# Patient Record
Sex: Female | Born: 1975 | Hispanic: No | Marital: Married
Health system: Southern US, Community
[De-identification: ages and names within clinical notes are randomized; demographics above are authoritative.]

---

## 2000-10-03 ENCOUNTER — Other Ambulatory Visit: Admission: RE | Admit: 2000-10-03 | Discharge: 2000-10-03 | Payer: Self-pay | Admitting: *Deleted

## 2002-01-02 ENCOUNTER — Other Ambulatory Visit: Admission: RE | Admit: 2002-01-02 | Discharge: 2002-01-02 | Payer: Self-pay | Admitting: *Deleted

## 2006-02-15 ENCOUNTER — Encounter: Admission: RE | Admit: 2006-02-15 | Discharge: 2006-02-15 | Payer: Self-pay | Admitting: Internal Medicine

## 2006-05-23 ENCOUNTER — Encounter: Admission: RE | Admit: 2006-05-23 | Discharge: 2006-08-21 | Payer: Self-pay | Admitting: Surgery

## 2006-05-24 ENCOUNTER — Ambulatory Visit (HOSPITAL_COMMUNITY): Admission: RE | Admit: 2006-05-24 | Discharge: 2006-05-24 | Payer: Self-pay | Admitting: Gastroenterology

## 2006-05-28 ENCOUNTER — Ambulatory Visit (HOSPITAL_COMMUNITY): Admission: RE | Admit: 2006-05-28 | Discharge: 2006-05-28 | Payer: Self-pay | Admitting: Surgery

## 2006-07-04 ENCOUNTER — Ambulatory Visit (HOSPITAL_BASED_OUTPATIENT_CLINIC_OR_DEPARTMENT_OTHER): Admission: RE | Admit: 2006-07-04 | Discharge: 2006-07-04 | Payer: Self-pay | Admitting: Surgery

## 2006-07-14 ENCOUNTER — Ambulatory Visit: Payer: Self-pay | Admitting: Internal Medicine

## 2006-12-16 ENCOUNTER — Encounter: Admission: RE | Admit: 2006-12-16 | Discharge: 2007-03-16 | Payer: Self-pay | Admitting: Surgery

## 2006-12-30 ENCOUNTER — Inpatient Hospital Stay (HOSPITAL_COMMUNITY): Admission: RE | Admit: 2006-12-30 | Discharge: 2007-01-01 | Payer: Self-pay | Admitting: General Surgery

## 2006-12-31 ENCOUNTER — Ambulatory Visit: Payer: Self-pay | Admitting: Vascular Surgery

## 2007-03-18 ENCOUNTER — Encounter: Admission: RE | Admit: 2007-03-18 | Discharge: 2007-06-04 | Payer: Self-pay | Admitting: Surgery

## 2009-07-22 ENCOUNTER — Ambulatory Visit (HOSPITAL_COMMUNITY): Admission: RE | Admit: 2009-07-22 | Discharge: 2009-07-22 | Payer: Self-pay | Admitting: Internal Medicine

## 2009-07-29 ENCOUNTER — Ambulatory Visit (HOSPITAL_COMMUNITY): Admission: RE | Admit: 2009-07-29 | Discharge: 2009-07-29 | Payer: Self-pay | Admitting: Internal Medicine

## 2009-08-02 ENCOUNTER — Ambulatory Visit (HOSPITAL_COMMUNITY): Admission: RE | Admit: 2009-08-02 | Discharge: 2009-08-02 | Payer: Self-pay | Admitting: Obstetrics and Gynecology

## 2009-08-23 DEATH — deceased

## 2009-08-24 ENCOUNTER — Ambulatory Visit (HOSPITAL_COMMUNITY): Admission: RE | Admit: 2009-08-24 | Discharge: 2009-08-24 | Payer: Self-pay | Admitting: Obstetrics and Gynecology

## 2010-08-23 ENCOUNTER — Inpatient Hospital Stay (HOSPITAL_COMMUNITY): Admission: AD | Admit: 2010-08-23 | Payer: 59 | Source: Ambulatory Visit | Admitting: Obstetrics and Gynecology

## 2010-08-28 ENCOUNTER — Inpatient Hospital Stay (HOSPITAL_COMMUNITY)
Admission: AD | Admit: 2010-08-28 | Discharge: 2010-09-01 | DRG: 775 | Disposition: A | Discharge status: Home / Self Care | Payer: 59 | Source: Ambulatory Visit | Attending: Obstetrics and Gynecology | Admitting: Obstetrics and Gynecology

## 2010-08-28 DIAGNOSIS — O480 Post-term pregnancy: Principal | ICD-10-CM | POA: Diagnosis present

## 2010-08-28 DIAGNOSIS — O878 Other venous complications in the puerperium: Secondary | ICD-10-CM | POA: Diagnosis present

## 2010-08-28 DIAGNOSIS — E039 Hypothyroidism, unspecified: Secondary | ICD-10-CM | POA: Diagnosis present

## 2010-08-28 DIAGNOSIS — O99844 Bariatric surgery status complicating childbirth: Secondary | ICD-10-CM | POA: Diagnosis present

## 2010-08-28 DIAGNOSIS — E079 Disorder of thyroid, unspecified: Secondary | ICD-10-CM | POA: Diagnosis present

## 2010-08-28 DIAGNOSIS — K649 Unspecified hemorrhoids: Secondary | ICD-10-CM | POA: Diagnosis present

## 2010-08-31 LAB — CBC
MCH: 29 pg (ref 26.0–34.0)
Platelets: 157 10*3/uL (ref 150–400)
RDW: 13.2 % (ref 11.5–15.5)

## 2010-09-06 ENCOUNTER — Ambulatory Visit (HOSPITAL_COMMUNITY)
Admission: RE | Admit: 2010-09-06 | Discharge: 2010-09-06 | Disposition: A | Discharge status: Home / Self Care | Payer: 59 | Source: Ambulatory Visit | Attending: Obstetrics and Gynecology | Admitting: Obstetrics and Gynecology

## 2010-09-06 DIAGNOSIS — O9279 Other disorders of lactation: Secondary | ICD-10-CM | POA: Insufficient documentation

## 2010-09-11 ENCOUNTER — Ambulatory Visit (HOSPITAL_COMMUNITY)
Admission: RE | Admit: 2010-09-11 | Discharge: 2010-09-11 | Disposition: A | Discharge status: Home / Self Care | Payer: 59 | Source: Ambulatory Visit | Attending: Obstetrics and Gynecology | Admitting: Obstetrics and Gynecology

## 2010-09-11 DIAGNOSIS — O923 Agalactia: Secondary | ICD-10-CM | POA: Insufficient documentation

## 2010-09-14 LAB — CBC
Hemoglobin: 12.6 g/dL (ref 12.0–15.0)
Platelets: 310 10*3/uL (ref 150–400)
RBC: 4.13 MIL/uL (ref 3.87–5.11)
WBC: 8.5 10*3/uL (ref 4.0–10.5)

## 2010-09-14 LAB — BASIC METABOLIC PANEL
BUN: 3 mg/dL — ABNORMAL LOW (ref 6–23)
CO2: 24 mEq/L (ref 19–32)
Calcium: 9 mg/dL (ref 8.4–10.5)
Chloride: 104 mEq/L (ref 96–112)
Creatinine, Ser: 0.51 mg/dL (ref 0.4–1.2)
GFR calc Af Amer: >60 mL/min (ref >60)
Glucose, Bld: 92 mg/dL (ref 70–99)
Potassium: 3.9 mEq/L (ref 3.5–5.1)
Sodium: 135 mEq/L (ref 135–145)

## 2010-09-18 LAB — CBC: Platelets: 341 10*3/uL (ref 150–400)

## 2010-10-16 ENCOUNTER — Other Ambulatory Visit: Payer: Self-pay | Admitting: Obstetrics and Gynecology

## 2010-11-07 NOTE — Op Note (Signed)
Daisy Baldwin, Daisy Baldwin                ACCOUNT NO.:  [id number]   MEDICAL RECORD NO.:  [id number]          PATIENT TYPE:  INP   LOCATION:  0001                         FACILITY:  [facility]   PHYSICIAN:  Alfonse Ras, MD   DATE OF BIRTH:  Aug 11, 1975   DATE OF PROCEDURE:  12/30/2006  DATE OF DISCHARGE:                               OPERATIVE REPORT   PREOPERATIVE DIAGNOSIS:  Status post laparoscopic gastric bypass.   PROCEDURE:  Upper endoscopy.   FINDINGS:  Patent anastomosis, no evidence of leak.   ENDOSCOPIST:  Alfonse Ras, MD.   DESCRIPTION:  At the conclusion of laparoscopic Roux-en-Y gastric  bypass, I introduced the Olympus endoscope through the oropharynx  through the esophagus under direct vision.  The Z-line was identified  and the gastric pouch was entered.  The anastomosis was patent.  There  was no evidence of bleeding.  It was insufflated and there was no  evidence of bubbles in the intraperitoneum.  The pouch measured 6 cm in  length, was completely intact.  It was decompressed and the scope was  removed.  The remainder of the dictation will be dictated by Dr. Luretha Murphy.      Alfonse Ras, MD  Electronically Signed     KRE/MEDQ  D:  12/30/2006  T:  12/30/2006  Job:  [id number]   cc:   Thornton Park Daphine Deutscher, MD  [address], [address]  [place]  [place] [address]

## 2010-11-07 NOTE — Op Note (Signed)
Daisy Baldwin, Daisy Baldwin                ACCOUNT NO.:  [id number]   MEDICAL RECORD NO.:  [id number]          PATIENT TYPE:  INP   LOCATION:  0001                         FACILITY:  [facility]   PHYSICIAN:  Thornton Park. Daphine Deutscher, MD  DATE OF BIRTH:  1976/06/21   DATE OF PROCEDURE:  12/30/2006  DATE OF DISCHARGE:                               OPERATIVE REPORT   CCS NUMBER:  [id number].   PREOPERATIVE INDICATION:  Morbid obesity with BMI of 49 and centripetal  obesity.   POSTOPERATIVE DIAGNOSIS:  Morbid obesity with BMI of 49 and centripetal  obesity.   PROCEDURE:  Laparoscopic Roux-en-Y gastric bypass with an anticolic Roux  to the left facing 100-cm Roux limb, 40-cm bilobed pancreatic limb with  closure of Peterson's defect.  Endoscopy per Dr. Colin Benton.   SURGEON:  Luretha Murphy.   ASSISTANT:  Baruch Merl.   DESCRIPTION OF PROCEDURE:  Daisy Baldwin is a 35 year old taken to Room  1, given general anesthesia.  The abdomen was prepped with Techni-Care  and draped sterilely.  Access to the abdomen was gained with the OptiVu  technique in the left upper quadrant without difficulty.  She is a large  woman with most of her weight carried centrally, and after entering the  abdomen, it was insufflated.  Using the applied medical trocars, I  placed two on the right side, one slightly to the left of the umbilicus.  After insufflation, I identified the ligament of Treitz and then went  down 40 cm and divided the small bowel.  A suture was placed on the Roux  limb end and then 100 cm were measured and the BP limb was then sewn to  the Roux limb at that point.  Openings were made along the  antimesenteric borders with the harmonic scalpel and a 60 white  cartridge using the Covidien  stapler was inserted and fired.  The  common defect was closed from below in the crotch and then from either  end with 2-0 Vicryls which were tied in the middle.  I then reinforced  the crotch with a horizontal mattress suture  and then applied Tisseel  subsequently.  The mesenteric defect was closed with a running 2-0 silk,  and the Tisseel was applied after testing the anastomosis, and it looked  to be intact and a nice patent anastomosis.   Next, I divided the omentum which was quite large and quite thick, and I  divided it with harmonic scalpel.   Next, I placed the Nathanson retractor in the upper abdomen, retracting  the liver.  I measured about 5 cm down the lesser curve and divided with  some veins and created a dissection plane to go retrogastric which I was  able to do.  All tubes were pulled from the stomach, and with the again  auto suture, 60 stapler and blue cartridge, I fired across the lesser  curvature.  Multiple other findings were done using the 6 cm heading  north using an Ewald tube, intermittently placing it in the stomach to  make sure we did not impinge on the GE junction,  and subsequently a nice  pouch was carved out.  Tisseel was placed at the upper pole.   The roux limb was brought up in an anticolic fashion with a candy cane  to the left, and a back row suture was placed 2-0 Vicryl tying it on the  left side and then using a Lapra-Ty at the end on the right.  Openings  were then made on the right side through which the 45 auto suture device  was inserted and fired.  A nice anastomosis was present.  The common  defect was closed with 2-0 Vicryl from either end and tied in the  middle.  A second layer using a SH needle and Lapra-Ty was used again,  using the Ewald tube as a stent across the anastomosis.   I then clamped off, and Dr. Colin Benton endoscoped the patient.  We submerged  the anastomosis.  There were no bubbles produced at all; no evidence of  a leak.  We did have some difficulty with the scope in that it lacked a  valve, and I think the aspiration of the gas was somewhat lessened than  desired, so we ended up giving her a little bit of gas in her small  bowel.  At completion,  I looked, and there was a little area of serosal  abrasion in the Roux limb near the stapled area, and I put a U stitch of  2-0 Vicryl and tied that down.  Tisseel was then applied to that area  and also to the gastrojejunostomy.  All the irrigant that I put in as  much of it as possible was aspirated.  Peterson's defect was closed with  a 2-0 silk tacking then using Lapra-Ty.  We then deflated the abdomen.  The wounds I then closed with 4-0 Vicryl in the skin and with Dermabond.  The patient seemed to tolerate the procedure well and was taken to  recovery room in satisfactory condition.      Thornton Park Daphine Deutscher, MD  Electronically Signed     MBM/MEDQ  D:  12/30/2006  T:  12/30/2006  Job:  [id number]   cc:   Candyce Churn. Allyne Gee, M.D.  Fax: [phone]

## 2010-11-10 NOTE — Procedures (Signed)
Daisy Baldwin, Daisy Baldwin                ACCOUNT NO.:  [id number]   MEDICAL RECORD NO.:  [id number]          PATIENT TYPE:  OUT   LOCATION:  SLEEP CENTER                 FACILITY:  [facility]   PHYSICIAN:  Clinton D. Maple Hudson, MD, FCCP, FACPDATE OF BIRTH:  1974[birth year]   DATE OF STUDY:  07/04/2006                            NOCTURNAL POLYSOMNOGRAM   REFERRING PHYSICIAN:  Luretha Murphy MD   INDICATION FOR STUDY:  Hypersomnia with sleep apnea.   EPWORTH SLEEPINESS SCORE:  4/24, BMI 49.9, weight 311 pounds.   LISTED MEDICATION:  Synthroid.   SLEEP ARCHITECTURE:  Total sleep time 404 minutes with sleep efficiency  90%.  Stage I was 2%, stage II 50%, stages III and IV 22%, REM 26% of  total sleep time.  Sleep latency 26 minutes, REM latency 117 minutes,  awake after sleep onset 19 minutes, arousal index 11.1.  No bedtime  medication was reported.   RESPIRATORY DATA:  Apnea-hypopnea index (AHI, RDI) 7.3 obstructive  events per hour, indicating mild obstructive sleep apnea/hypopnea  syndrome.  There were 6 obstructive apneas and 43 hypopneas.  Events  were not positional.  REM AHI 23.4 per hour.  There were insufficient  events to qualify for split protocol CPAP titration on this study night.   OXYGEN DATA:  Mild to moderate intermittent snoring with oxygen  desaturation to a nadir of 86%.  Mean oxygen saturation through the  study was 96% on room air.   CARDIAC DATA:  Normal sinus rhythm.   MOVEMENT-PARASOMNIA:  A total of 77 limb jerks were recorded, of which  20 were associated with arousal or awakening, for a period limb movement  with arousal index of 3.4 per hour, which is mildly increased and may  contribute to mild daytime sleepiness.   IMPRESSIONS-RECOMMENDATIONS:  1. Unremarkable sleep architecture except for mildly increased time      spent in slow wave sleep, of doubtful clinical significance.  2. Mild obstructive sleep apnea/hypopnea syndrome, apnea-hypopnea      index 7.3 per hour  (normal range 0-5 per hour).  Events were not      positional.  Mild to moderate snoring with oxygen desaturation to a      nadir of 86%.  Mean oxygen saturation through the study was 96% on      room air.  A CPAP would not usually be used to treat      for scores in this range.  If needed immediately after anesthesia      and surgery, consider an empiric pressure of 10 CWP.  The best      treatment for this situation would probably be weight loss.      Clinton D. Maple Hudson, MD, [facility], FACP  Diplomate, Biomedical engineer of Sleep Medicine  Electronically Signed     CDY/MEDQ  D:  07/14/2006 09:17:43  T:  07/14/2006 11:43:15  Job:  [id number]

## 2010-12-06 IMAGING — US US OB LIMITED
1 series · 4 of 4 positions shown · non-contrast
Comparison: none

OBSTETRICAL ULTRASOUND:
 This ultrasound was performed in The [HOSPITAL], and the [facility] report will be stored to [REDACTED] PACS.  This report is also available in [HOSPITAL]?s accessANYware.

[Series 1: us ob limited · 4 of 4 slices shown]
[im 1/4]
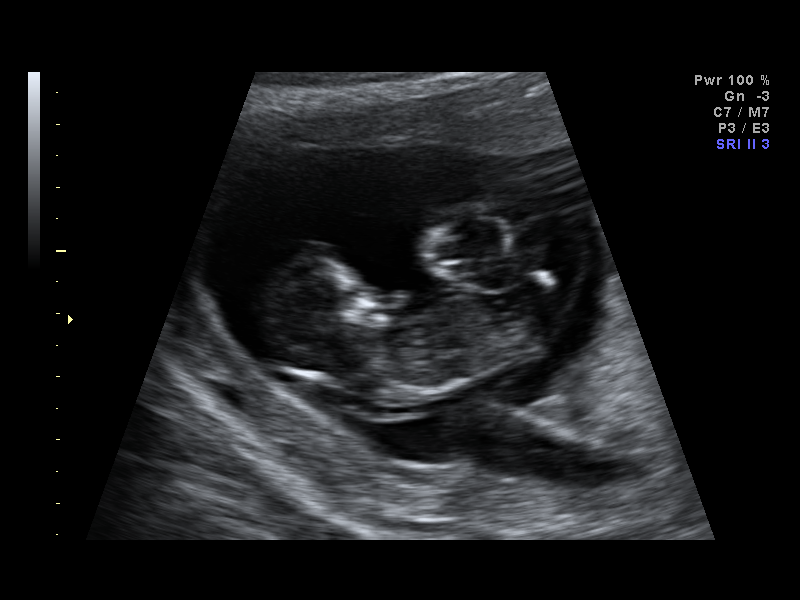
[im 2/4]
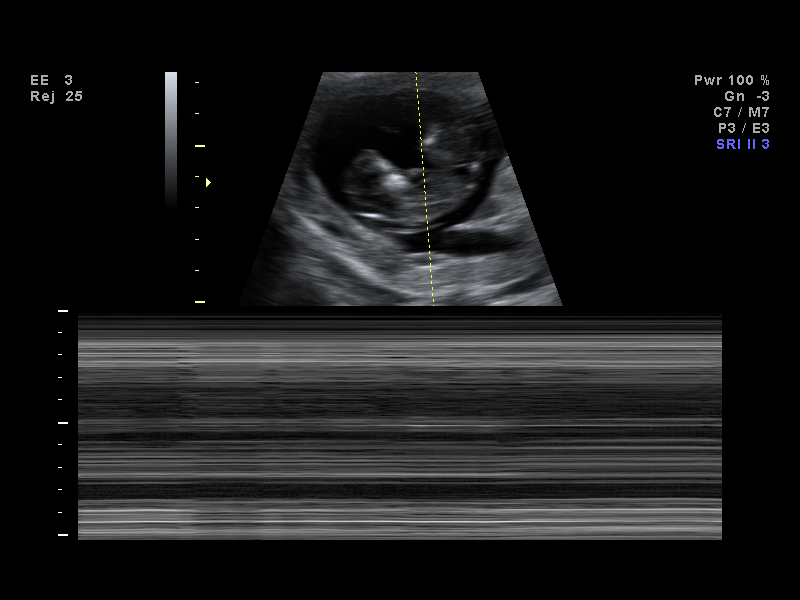
[im 3/4]
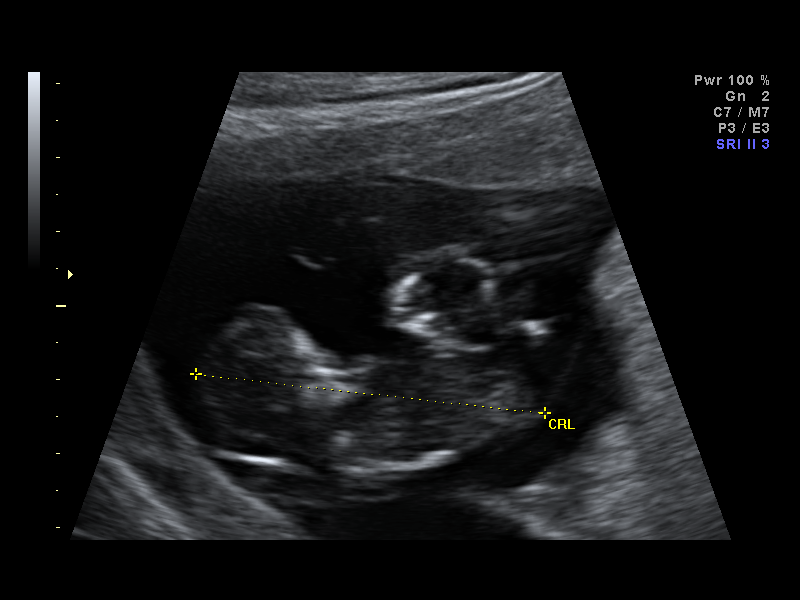
[im 4/4]
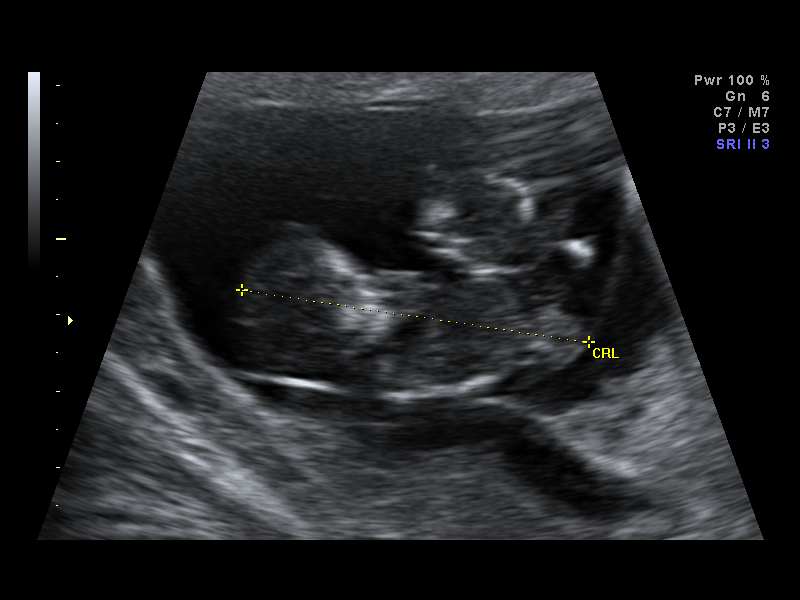

[4 of 4 positions shown; findings below may reference images not displayed]

IMPRESSION: [facility] has also been faxed to the ordering physician.

## 2011-01-01 IMAGING — US US TRANSVAGINAL NON-OB
1 series · 14 of 25 positions shown · non-contrast
Comparison: 07/29/2009

CLINICAL DATA: Recent failed twin pregnancy.  Missed abortion.
Evaluate for retained products.

TRANSVAGINAL ULTRASOUND OF PELVIS
TECHNIQUE: Transvaginal ultrasound examination of the pelvis was
performed including evaluation of the uterus, ovaries, adnexal
regions, and pelvic cul-de-sac.

[Series 1: us transvaginal non-ob · 36 acquisitions, 14 frames shown]
[im 1/36]
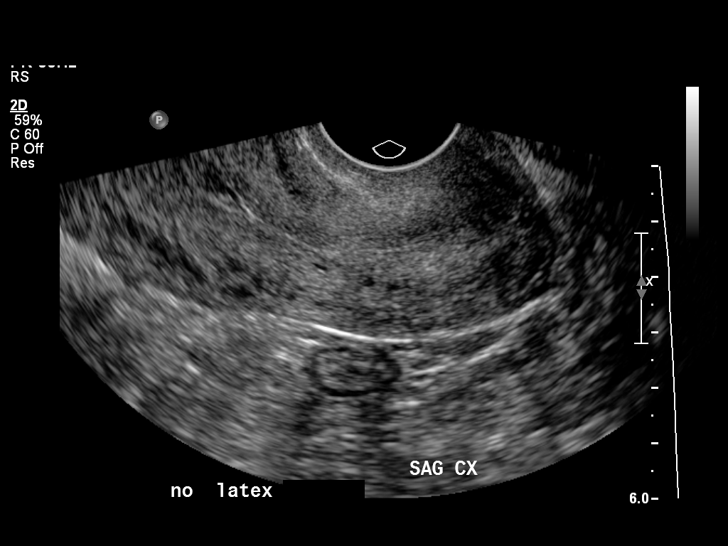
[im 3/36]
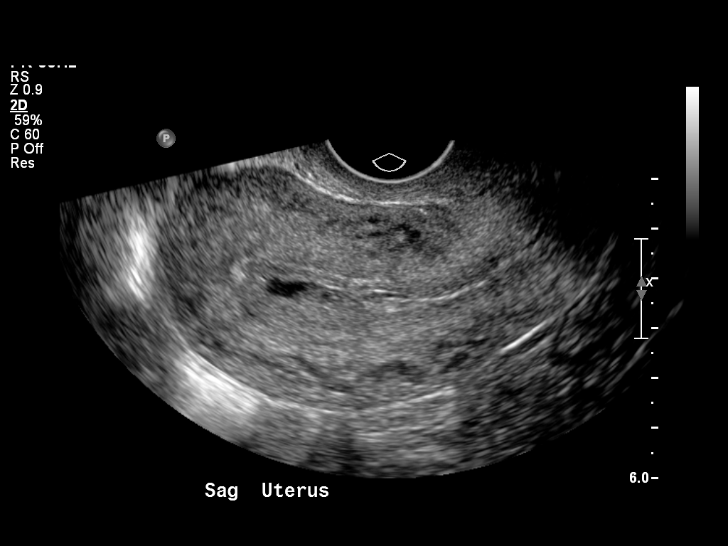
[im 6/36]
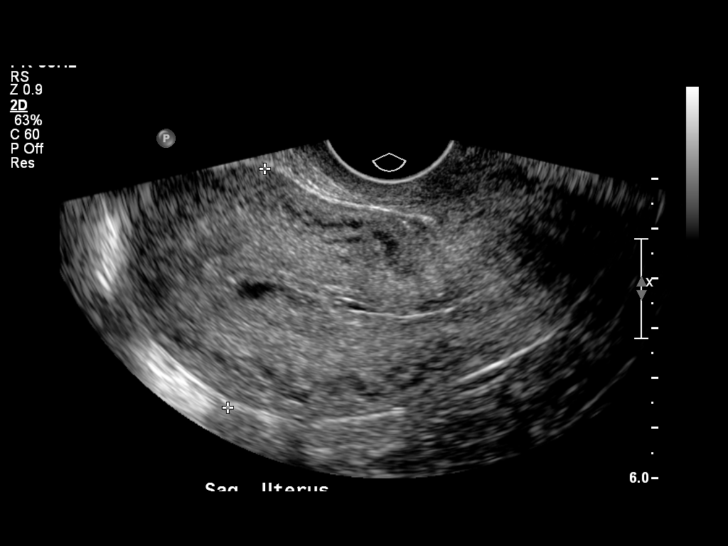
[im 9/36]
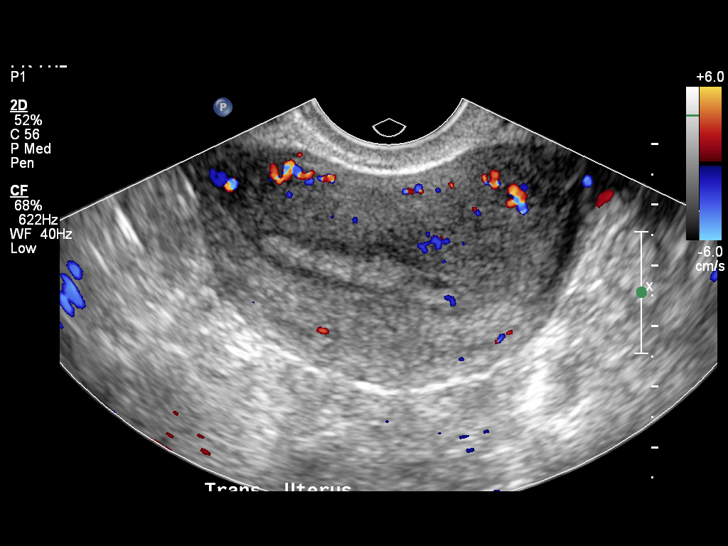
[im 12/36]
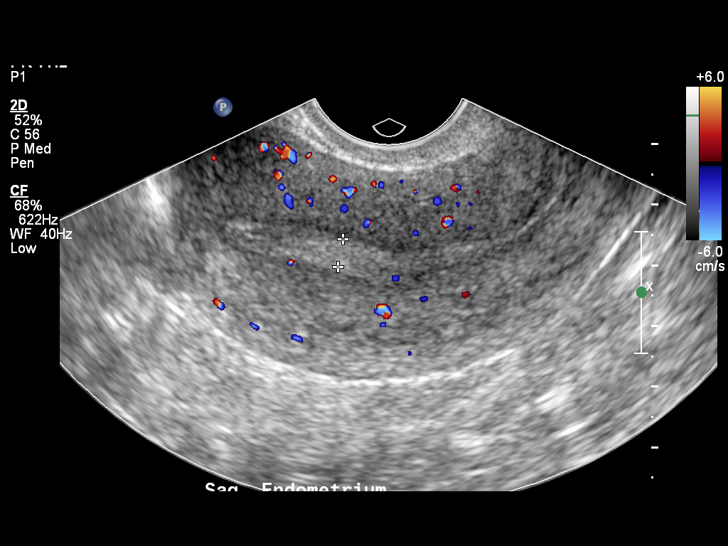
[im 14/36]
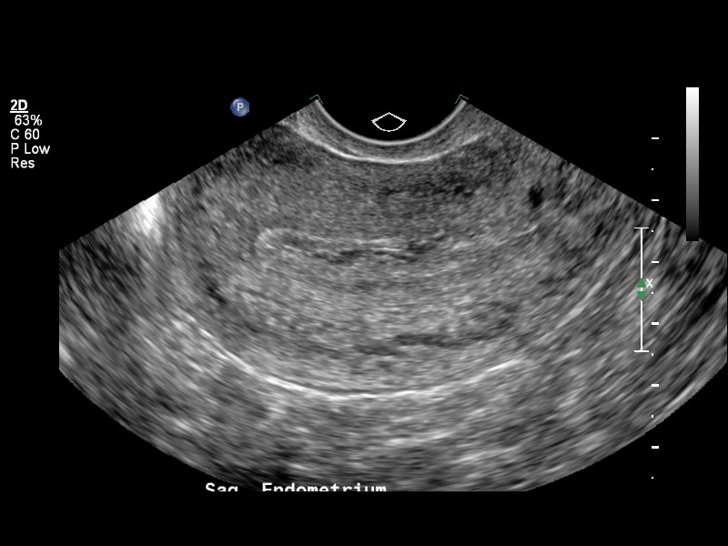
[im 17/36]
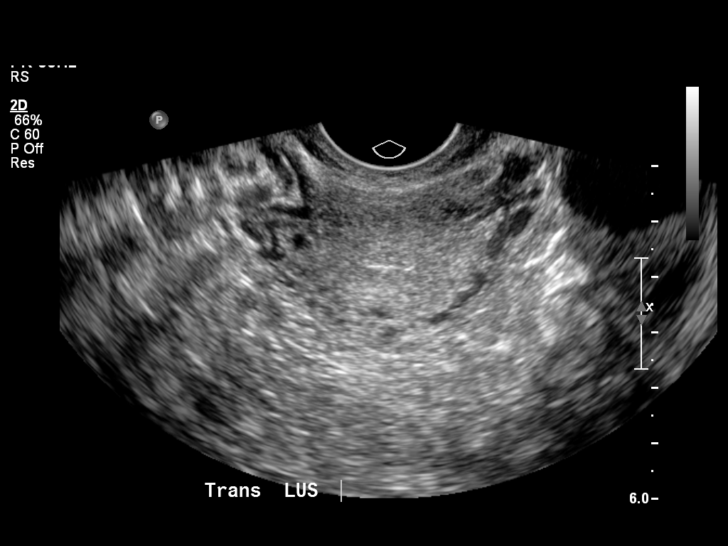
[im 19/36]
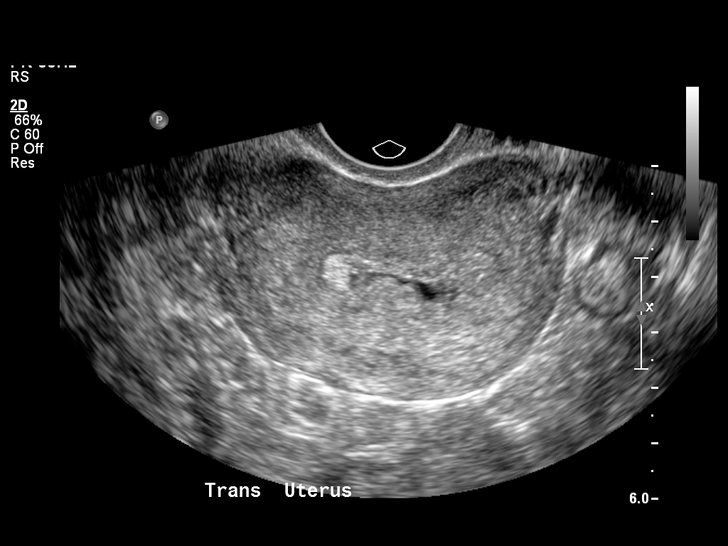
[im 22/36]
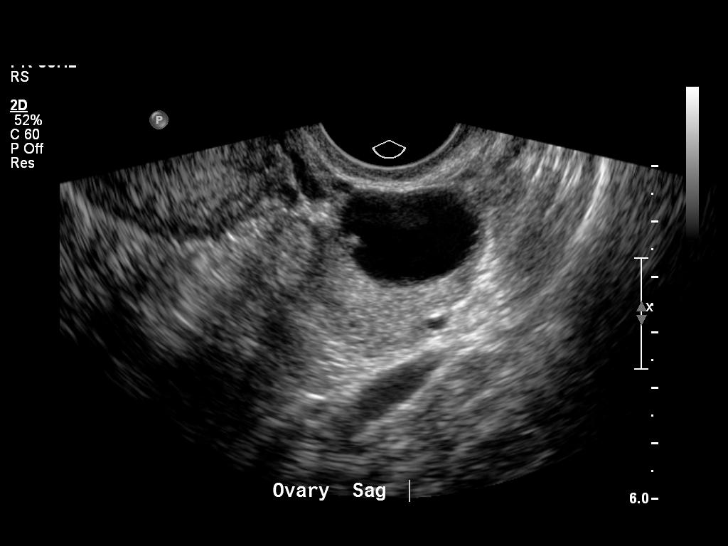
[im 24/36]
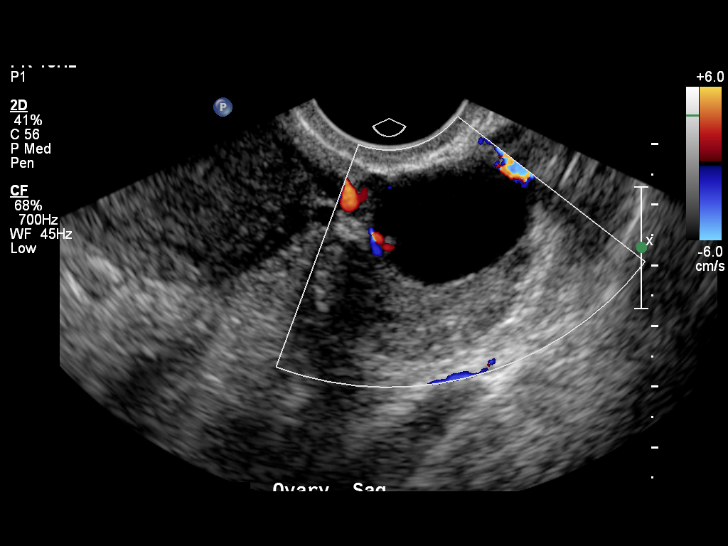
[im 27/36]
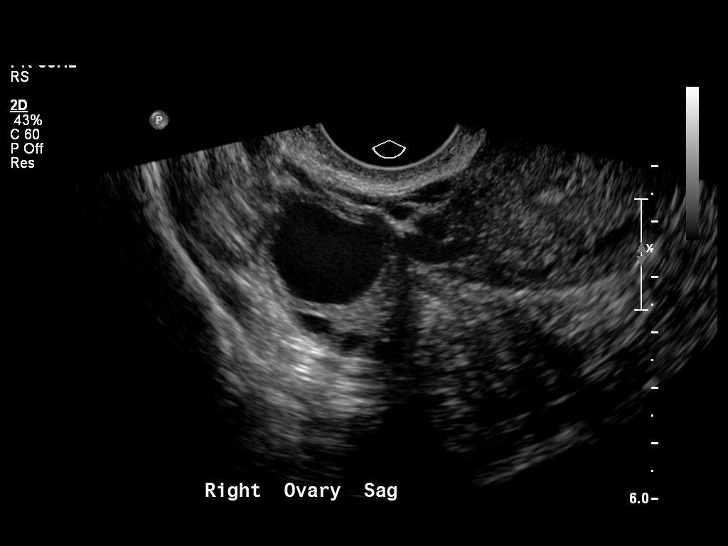
[im 30/36]
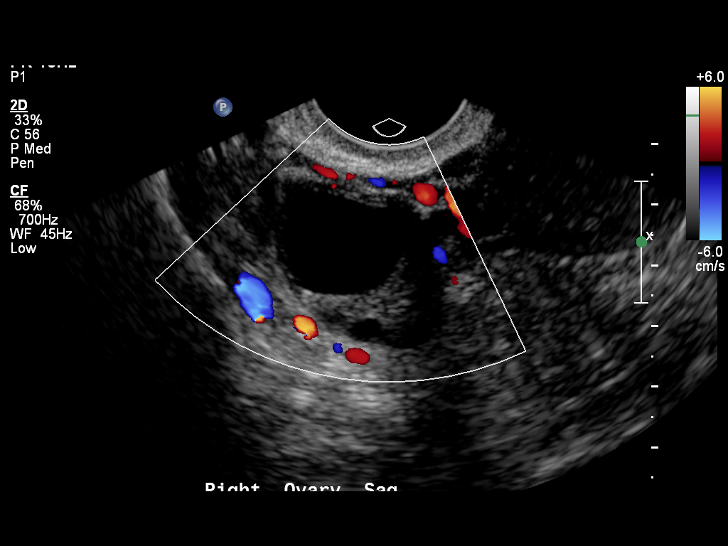
[im 33/36]
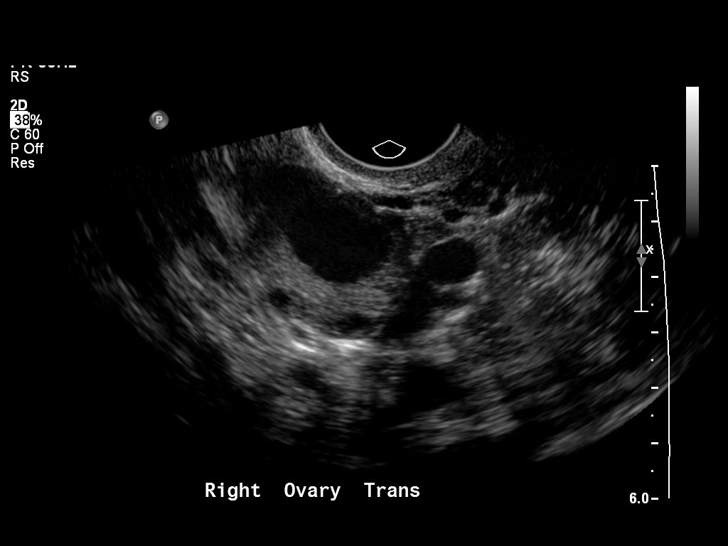
[im 36/36]
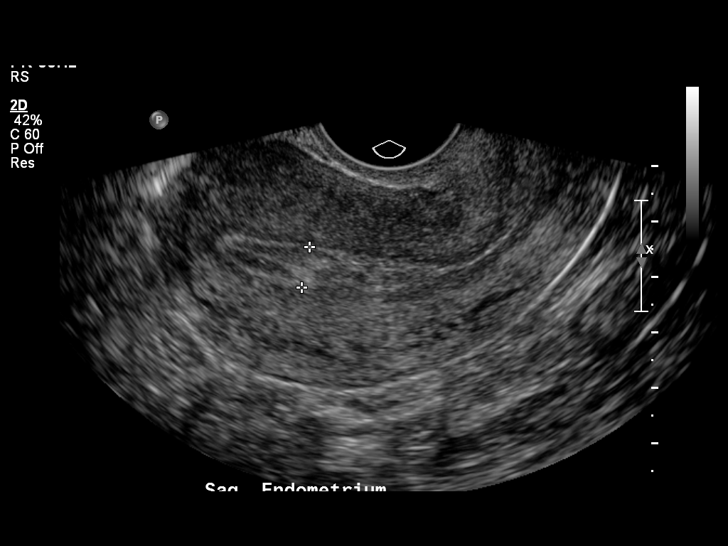

[14 of 25 positions shown; findings below may reference images not displayed]

FINDINGS: Uterus measures 9.3 x 4.9 x 5.9 cm.  No fibroids or other uterine
masses identified.

Endometrium measures 7 mm in thickness.  Tiny amount of fluid noted
in endometrial cavity, however no evidence of retained products.

Right Ovary measures 3.2 x 2.3 x 3.0 cm.  Normal appearance.

Left Ovary measures 3.4 x 2.6 x 2.8 cm.  Normal appearance.

Other Findings:  No adnexal mass or free fluid identified.
IMPRESSION: 1.  Minimal fluid noted in the endometrial cavity.  No evidence of
retained products of conception.
2.  Normal ovaries.  No evidence of adnexal mass.

## 2011-04-10 LAB — COMPREHENSIVE METABOLIC PANEL
AST: 17
Albumin: 3.9
BUN: 10
Calcium: 9.8
Chloride: 105
Creatinine, Ser: 0.88
GFR calc Af Amer: >60
Glucose, Bld: 109 — ABNORMAL HIGH
Sodium: 140

## 2011-04-10 LAB — DIFFERENTIAL
Basophils Absolute: 0
Basophils Absolute: 0.1
Basophils Relative: 0
Basophils Relative: 0
Basophils Relative: 1
Eosinophils Absolute: 0.1
Eosinophils Relative: 1
Lymphocytes Relative: 7 — ABNORMAL LOW
Lymphs Abs: 1.1
Lymphs Abs: 1.7
Monocytes Absolute: 0.4
Monocytes Absolute: 0.5
Monocytes Absolute: 0.8 — ABNORMAL HIGH
Monocytes Relative: 4
Monocytes Relative: 5
Monocytes Relative: 8
Neutro Abs: 13 — ABNORMAL HIGH
Neutro Abs: 5.5

## 2011-04-10 LAB — CBC
MCHC: 34.1
MCHC: 34.4
MCV: 86.2
MCV: 86.2
Platelets: 256
Platelets: 281
Platelets: 322
RBC: 4.17
RBC: 4.29
RBC: 4.72
RDW: 12.7
RDW: 12.9
WBC: 14.6 — ABNORMAL HIGH
WBC: 9.9

## 2019-03-05 ENCOUNTER — Ambulatory Visit (INDEPENDENT_AMBULATORY_CARE_PROVIDER_SITE_OTHER): Payer: 59

## 2019-03-05 ENCOUNTER — Telehealth: Payer: Self-pay | Admitting: *Deleted

## 2019-03-05 ENCOUNTER — Ambulatory Visit: Payer: 59 | Admitting: Podiatry

## 2019-03-05 ENCOUNTER — Other Ambulatory Visit: Payer: Self-pay

## 2019-03-05 ENCOUNTER — Other Ambulatory Visit: Payer: Self-pay | Admitting: Podiatry

## 2019-03-05 VITALS — Temp 98.2°F

## 2019-03-05 DIAGNOSIS — M7732 Calcaneal spur, left foot: Secondary | ICD-10-CM | POA: Diagnosis not present

## 2019-03-05 DIAGNOSIS — M79671 Pain in right foot: Secondary | ICD-10-CM

## 2019-03-05 DIAGNOSIS — M722 Plantar fascial fibromatosis: Secondary | ICD-10-CM

## 2019-03-05 DIAGNOSIS — M216X9 Other acquired deformities of unspecified foot: Secondary | ICD-10-CM

## 2019-03-05 NOTE — Patient Instructions (Addendum)

## 2019-03-05 NOTE — Progress Notes (Signed)
D

## 2019-03-05 NOTE — Telephone Encounter (Signed)
I informed pt Dr. March Rummage would like her to have the AVN with the proper frequency and duration for the stretches and I asked how she would like to receive them. Pt asked that I email to [email or web address]. emailed 03/05/2019 AVN to pt.

## 2019-03-05 NOTE — Progress Notes (Signed)
  Subjective:  Patient ID: Daisy Baldwin, female    DOB: 01/18/1976,  MRN: [id number]  Chief Complaint  Patient presents with  . Foot Pain    Pt states right plantar heel pain which is worse in the morning, severe in quality and a duration of 6 months. Pt states no known injuries.     43 y.o. female presents with the above complaint. Hx as above.   Review of Systems: Negative except as noted in the HPI. Denies N/V/F/Ch.  No past medical history on file. No current outpatient medications on file.  Social History   Tobacco Use  Smoking Status Not on file    No Known Allergies Objective:   Vitals:   03/05/19 1515  Temp: 98.2 F (36.8 C)   There is no height or weight on file to calculate BMI. Constitutional Well developed. Well nourished.  Vascular Dorsalis pedis pulses palpable bilaterally. Posterior tibial pulses palpable bilaterally. Capillary refill normal to all digits.  No cyanosis or clubbing noted. Pedal hair growth normal.  Neurologic Normal speech. Oriented to person, place, and time. Epicritic sensation to light touch grossly present bilaterally.  Dermatologic Nails well groomed and normal in appearance. No open wounds. No skin lesions.  Orthopedic: Normal joint ROM without pain or crepitus bilaterally. No visible deformities. Tender to palpation at the calcaneal tuber right. No pain with calcaneal squeeze left. Ankle ROM diminished range of motion right. Silfverskiold Test: positive left.   Radiographs: Taken and reviewed. No acute fractures or dislocations. No evidence of stress fracture.  Plantar heel spur present. Posterior heel spur present.   Assessment:   1. Plantar fasciitis   2. Calcaneal spur of left foot   3. Equinus deformity of foot    Plan:  Patient was evaluated and treated and all questions answered.  Plantar Fasciitis, right - XR reviewed as above.  - Educated on icing and stretching. Instructions given.  - Injection  delivered to the plantar fascia as below. - DME: PF brace dispensed.   Procedure: Injection Tendon/Ligament Location: Right plantar fascia at the glabrous junction; medial approach. Skin Prep: alcohol Injectate: 1 cc 0.5% marcaine plain, 1 cc dexamethasone phosphate, 0.5 cc kenalog 10. Disposition: Patient tolerated procedure well. Injection site dressed with a band-aid.  Return in about 3 weeks (around 03/26/2019) for Plantar fasciitis, Right.

## 2019-03-26 ENCOUNTER — Ambulatory Visit: Payer: 59 | Admitting: Podiatry
